# Patient Record
Sex: Female | Born: 2000 | Race: Black or African American | Hispanic: No | Marital: Single
Health system: Southern US, Community
[De-identification: ages and names within clinical notes are randomized; demographics above are authoritative.]

## PROBLEM LIST (undated history)

## (undated) DIAGNOSIS — J302 Other seasonal allergic rhinitis: Secondary | ICD-10-CM

## (undated) HISTORY — DX: Other seasonal allergic rhinitis: J30.2

---
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
## 2021-06-16 ENCOUNTER — Encounter: Payer: Self-pay | Admitting: *Deleted

## 2021-11-22 IMAGING — CT CT ANGIO HEAD
3 of 10 series · 17 of 47 positions shown · IV contrast (APPLIED)
Comparison: None

CLINICAL DATA: Stroke/TIA, assess intracranial arteries. Sharp
sudden thunderclap headache that woke patient out of her sleep.
History of migraines, current headache is completely atypical for
patient.

EXAM:
CT ANGIOGRAPHY HEAD
TECHNIQUE: Multidetector CT imaging of the head was performed using the
standard protocol during bolus administration of intravenous
contrast. Multiplanar CT image reconstructions and MIPs were
obtained to evaluate the vascular anatomy.
CONTRAST:  75mL OMNIPAQUE IOHEXOL 350 MG/ML SOLN

[Series 10: ax thin · axial · 0.35mm/px · z∈[-162,+3]mm · 11 of 202 slices shown]
[im 13/202  brain]
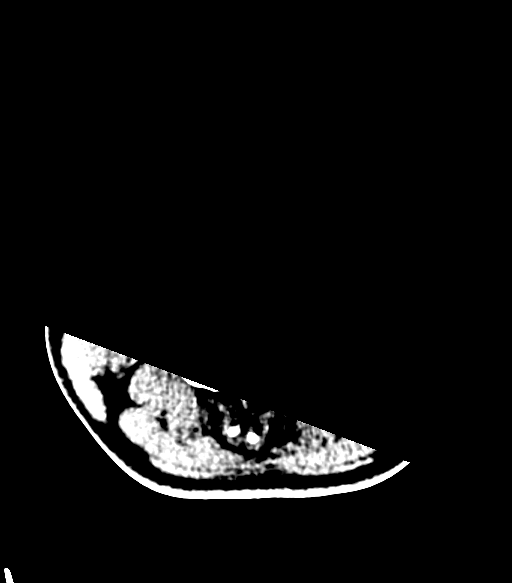
[im 38/202  bone]
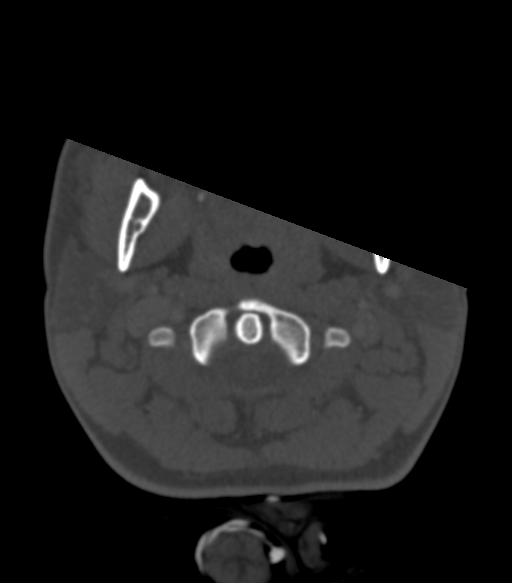
[im 51/202  brain]
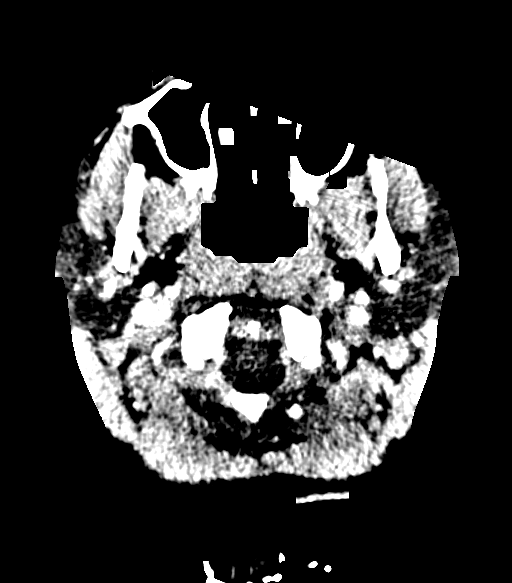
[im 63/202  bone]
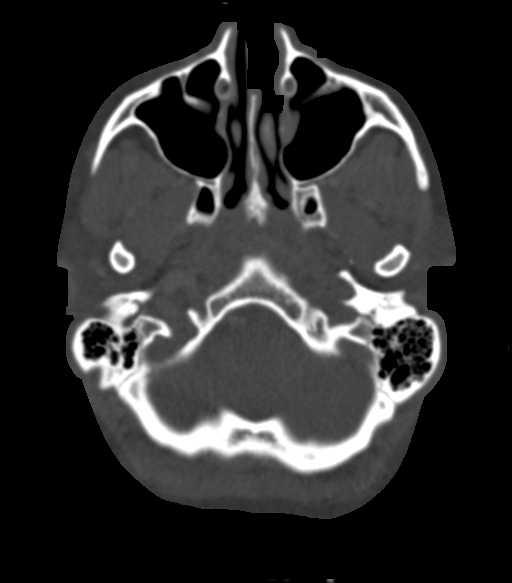
[im 88/202  brain]
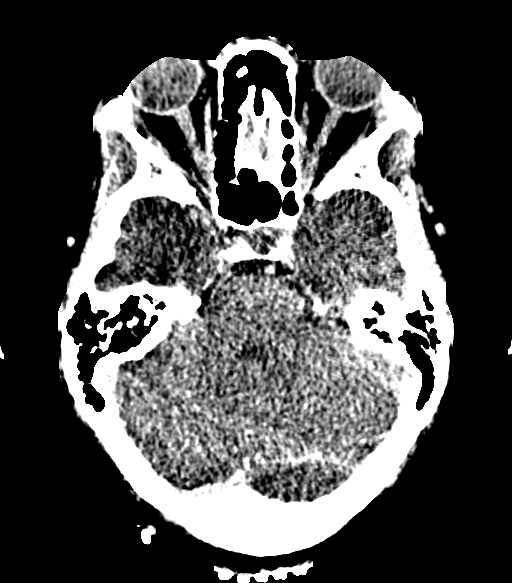
[im 101/202  bone]
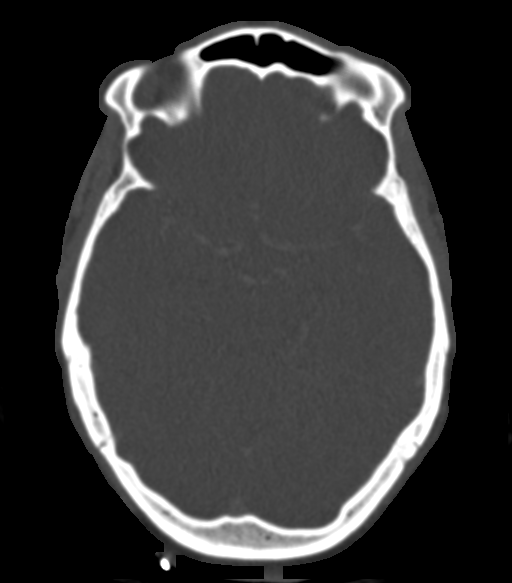
[im 114/202  brain]
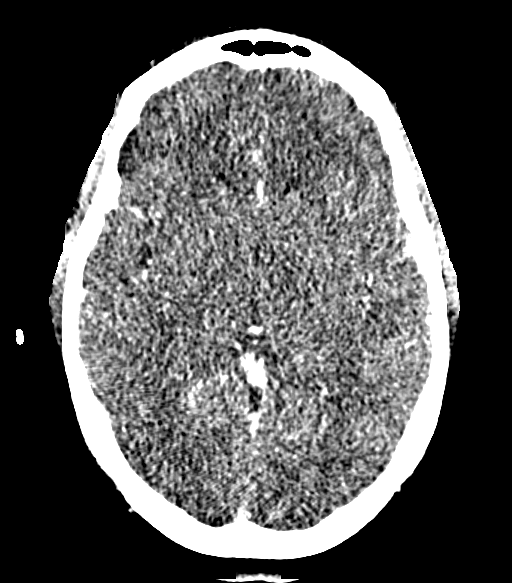
[im 139/202  bone]
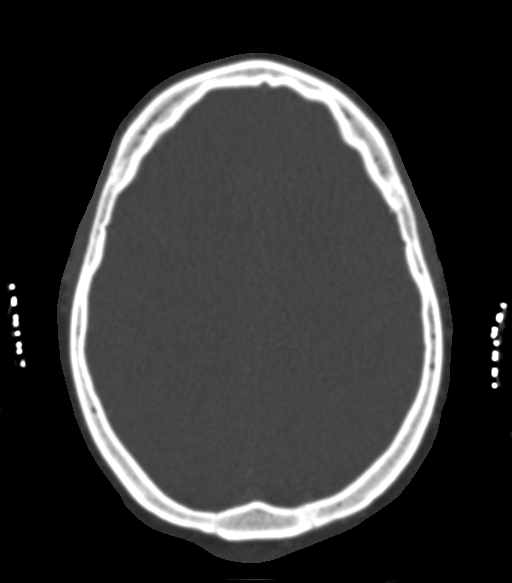
[im 151/202  brain]
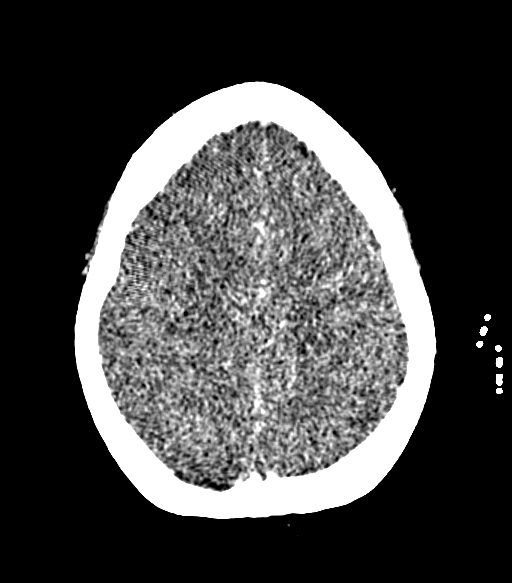
[im 164/202  bone]
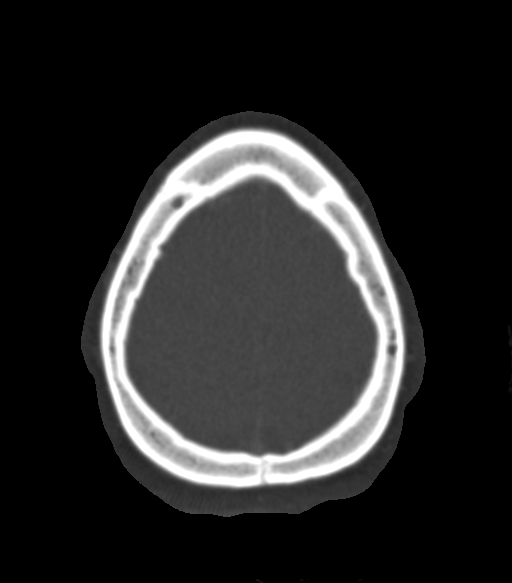
[im 189/202  brain]
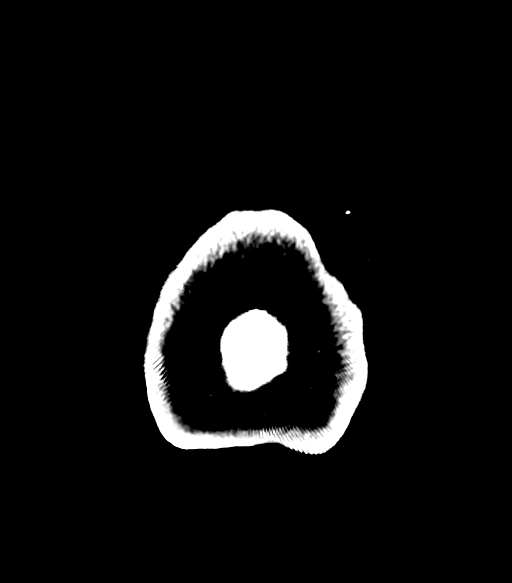

[Series 12: cor thin · coronal · 0.35mm/px · 3 of 206 slices shown]
[im 39/206  brain]
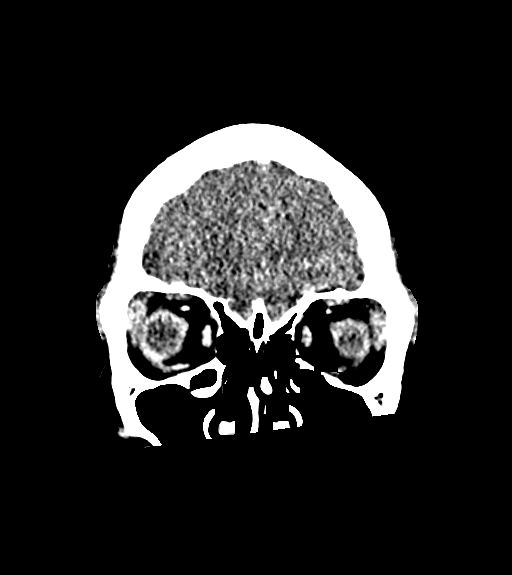
[im 72/206  brain]
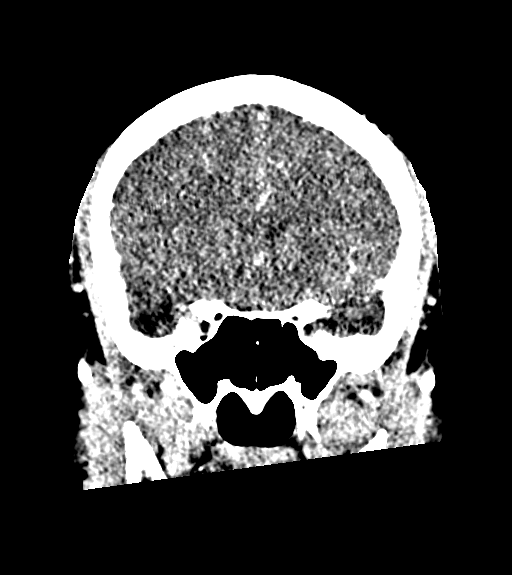
[im 106/206  brain]
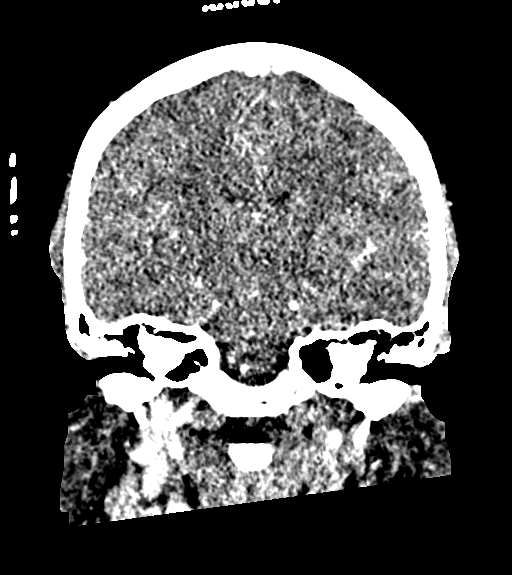

[Series 14: sag thin · sagittal · 0.39mm/px · 3 of 180 slices shown]
[im 45/180  brain]
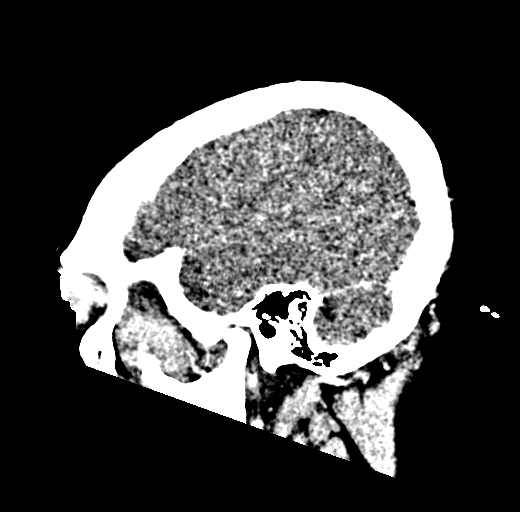
[im 90/180  brain]
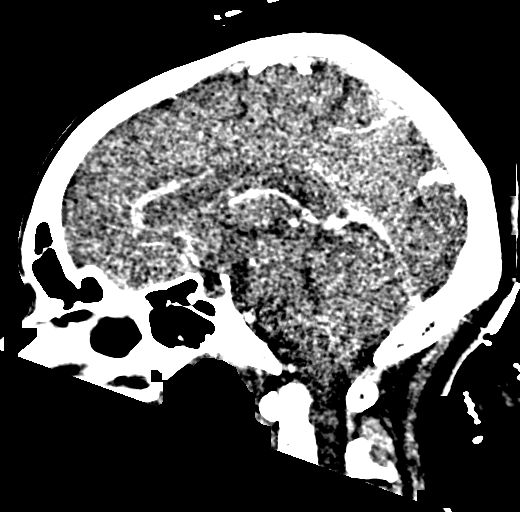
[im 135/180  brain]
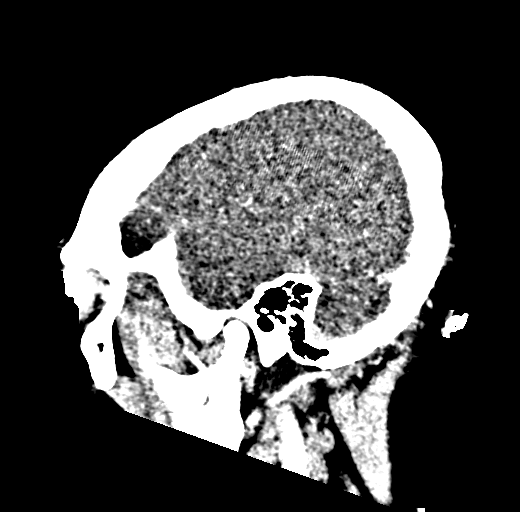

[17 of 47 positions shown; findings below may reference images not displayed]

FINDINGS: CT HEAD

Brain: There is no evidence of an acute infarct, intracranial
hemorrhage, mass, midline shift, or extra-axial fluid collection.
The ventricles and sulci are normal. The cerebellar tonsils are
normally positioned.

Vascular: No hyperdense vessel.

Skull: No fracture or suspicious osseous lesion.

Sinuses: Visualized paranasal sinuses and mastoid air cells are
clear.

Other: Unremarkable.

CTA HEAD

Arterial assessment is limited by suboptimal contrast timing.

Anterior circulation: The intracranial internal carotid arteries and
the ACAs and MCAs are patent with detailed assessment (including of
the branch vessels and for a proximal stenosis) limited by
suboptimal contrast timing. No aneurysm is identified.

Posterior circulation: The intracranial vertebral arteries are
patent to the basilar and codominant. The basilar artery and
proximal PCAs are patent with detailed assessment (including for
stenosis) limited by suboptimal contrast timing. There is a
moderate-sized left posterior communicating artery. No aneurysm is
identified.

Venous sinuses: Patent.

Anatomic variants: None.

Review of the MIP images confirms the above findings.
IMPRESSION: 1. Unremarkable noncontrast CT appearance of the brain. No evidence
of acute intracranial abnormality.
2. Suboptimal arterial assessment. No large vessel occlusion or
sizable aneurysm identified.
